# Patient Record
Sex: Male | Born: 1990 | Race: White | Hispanic: No | Marital: Single | Smoking: Never smoker
Health system: Southern US, Community
[De-identification: ages and names within clinical notes are randomized; demographics above are authoritative.]

## PROBLEM LIST (undated history)

## (undated) DIAGNOSIS — M419 Scoliosis, unspecified: Secondary | ICD-10-CM

## (undated) DIAGNOSIS — M545 Low back pain, unspecified: Secondary | ICD-10-CM

## (undated) DIAGNOSIS — K8020 Calculus of gallbladder without cholecystitis without obstruction: Secondary | ICD-10-CM

## (undated) DIAGNOSIS — M5146 Schmorl's nodes, lumbar region: Secondary | ICD-10-CM

## (undated) HISTORY — DX: Schmorl's nodes, lumbar region: M51.46

## (undated) HISTORY — PX: NO PAST SURGERIES: SHX2092

## (undated) HISTORY — DX: Low back pain, unspecified: M54.50

## (undated) HISTORY — DX: Scoliosis, unspecified: M41.9

## (undated) HISTORY — DX: Low back pain: M54.5

## (undated) HISTORY — DX: Calculus of gallbladder without cholecystitis without obstruction: K80.20

---

## 2018-05-19 ENCOUNTER — Encounter: Payer: Self-pay | Admitting: Physician Assistant

## 2018-05-19 ENCOUNTER — Ambulatory Visit (INDEPENDENT_AMBULATORY_CARE_PROVIDER_SITE_OTHER): Payer: BLUE CROSS/BLUE SHIELD

## 2018-05-19 ENCOUNTER — Ambulatory Visit (INDEPENDENT_AMBULATORY_CARE_PROVIDER_SITE_OTHER): Payer: BLUE CROSS/BLUE SHIELD | Admitting: Physician Assistant

## 2018-05-19 ENCOUNTER — Encounter (INDEPENDENT_AMBULATORY_CARE_PROVIDER_SITE_OTHER): Payer: Self-pay

## 2018-05-19 VITALS — BP 123/70 | HR 60 | Ht 74.0 in | Wt 248.0 lb

## 2018-05-19 DIAGNOSIS — Z8739 Personal history of other diseases of the musculoskeletal system and connective tissue: Secondary | ICD-10-CM | POA: Diagnosis not present

## 2018-05-19 DIAGNOSIS — M549 Dorsalgia, unspecified: Secondary | ICD-10-CM | POA: Diagnosis not present

## 2018-05-19 DIAGNOSIS — M25512 Pain in left shoulder: Secondary | ICD-10-CM | POA: Diagnosis not present

## 2018-05-19 DIAGNOSIS — E6609 Other obesity due to excess calories: Secondary | ICD-10-CM | POA: Diagnosis not present

## 2018-05-19 DIAGNOSIS — G8929 Other chronic pain: Secondary | ICD-10-CM

## 2018-05-19 DIAGNOSIS — M542 Cervicalgia: Secondary | ICD-10-CM

## 2018-05-19 DIAGNOSIS — Z13220 Encounter for screening for lipoid disorders: Secondary | ICD-10-CM | POA: Diagnosis not present

## 2018-05-19 DIAGNOSIS — M545 Low back pain, unspecified: Secondary | ICD-10-CM | POA: Insufficient documentation

## 2018-05-19 DIAGNOSIS — Z113 Encounter for screening for infections with a predominantly sexual mode of transmission: Secondary | ICD-10-CM | POA: Diagnosis not present

## 2018-05-19 DIAGNOSIS — Z131 Encounter for screening for diabetes mellitus: Secondary | ICD-10-CM | POA: Diagnosis not present

## 2018-05-19 DIAGNOSIS — Z130 Encounter for screening for diseases of the blood and blood-forming organs and certain disorders involving the immune mechanism: Secondary | ICD-10-CM

## 2018-05-19 DIAGNOSIS — Z7689 Persons encountering health services in other specified circumstances: Secondary | ICD-10-CM

## 2018-05-19 DIAGNOSIS — M62830 Muscle spasm of back: Secondary | ICD-10-CM | POA: Diagnosis not present

## 2018-05-19 NOTE — Progress Notes (Signed)
HPI:                                                                Christopher Morrow is a 27 y.o. male [facility] today to establish care  Current concerns: neck/back/shoulder pain  Reports history of scoliosis diagnosed in his mid-teens. He was treated by a chiropractor as well as physiotherapy at that time. Reports this was helpful. He occasionally has flare-ups of neck and back pain where he reports "sleeping on it wrong." Pain is moderate, intermittent located in the bilateral low back and posterior left-sided neck. Reports a constant dull ache in his left shoulder blade. No known injury or trauma. Denies paresthesias/numbness/tingling/extremity weakness. Denies bowel/bladder dysfunction, saddle numbness, constitutional symptoms. Occasionally takes OTC NSAIDs, which are helpful.  No flowsheet data found.  No flowsheet data found.    Past Medical History:  Diagnosis Date  . Low back pain   . Scoliosis    Past Surgical History:  Procedure Laterality Date  . NO PAST SURGERIES     Social History   Tobacco Use  . Smoking status: Never Smoker  . Smokeless tobacco: Never Used  Substance Use Topics  . Alcohol use: Yes    Alcohol/week: 0.6 oz    Types: 1 Standard drinks or equivalent per week   family history includes Charcot-Marie-Tooth disease in his paternal grandmother; Diabetes in his maternal grandmother; Fibromyalgia in his maternal grandmother; Hypertension in his father, maternal grandmother, and mother; Multiple sclerosis in his father; Obesity in his mother; Rheum arthritis in his maternal grandmother and paternal grandmother.    ROS: Review of Systems  HENT:       + hearing change  Musculoskeletal: Positive for back pain, joint pain, myalgias and neck pain.  All other systems reviewed and are negative.    Medications: Current Outpatient Medications  Medication Sig Dispense Refill  . Multiple  Vitamins-Minerals (MULTIVITAMIN PO) Take by mouth.     No current facility-administered medications for this visit.    No Known Allergies     Objective:  BP 123/70   Pulse 60   Ht 6\' 2"  (1.88 m)   Wt 248 lb (112.5 kg)   BMI 31.84 kg/m  Gen:  alert, not ill-appearing, no distress, appropriate for age, obese male HEENT: head normocephalic without obvious abnormality, conjunctiva and cornea clear, trachea midline Pulm: Normal work of breathing, normal phonation, clear to auscultation bilaterally, no wheezes, rales or rhonchi CV: Normal rate, regular rhythm, s1 and s2 distinct, no murmurs, clicks or rubs  Neuro: alert and oriented x 3, no tremor MSK: extremities atraumatic, normal gait and station Neck/back: atraumatic, no spinous process tenderness, no abnormal curvature Left shoulder: atraumatic, no a/c joint tenderness, ROM intact, strength 5/5, negative Hawkins, negative Neers, negative lift off Skin: intact, no rashes on exposed skin, no jaundice, no cyanosis Psych: well-groomed, cooperative, good eye contact, euthymic mood, affect mood-congruent, speech is articulate, and thought processes clear and goal-directed    No results found for this or any previous visit (from the past 72 hour(s)). Dg Cervical Spine Complete  Result Date: 05/19/2018 CLINICAL DATA:  Chronic neck pain left-sided worse when raising arm. EXAM: CERVICAL SPINE - COMPLETE 4+ VIEW COMPARISON:  None. FINDINGS:  Vertebral body alignment, heights and disc space heights are normal. There is minimal uncovertebral joint spurring present. Atlantoaxial articulation is normal. Neural foramina are patent bilaterally. IMPRESSION: Minimal uncovertebral joint spurring, otherwise unremarkable cervical spine. Electronically Signed   By: Elberta Fortis M.D.   On: 05/19/2018 15:01   Dg Lumbar Spine Complete  Result Date: 05/19/2018 CLINICAL DATA:  27 year old male with chronic mid lower back pain for years. No injury. Initial  encounter. EXAM: LUMBAR SPINE - COMPLETE 4+ VIEW COMPARISON:  None. FINDINGS: Slight straightening of the lumbar spine without significant disc space narrowing. Minimal to mild Schmorl's node deformities L1-2 through L5-S1 (most notable inferior aspect L5). No fracture or pars defect noted. Multiple calcified gallstones. IMPRESSION: Minimal to mild Schmorl's node deformities L1-2 through L5-S1 (most notable inferior aspect L5). Multiple calcified gallstones. Electronically Signed   By: Lacy Duverney M.D.   On: 05/19/2018 15:03      Assessment and Plan: 27 y.o. male with   Encounter to establish care  Routine screening for STI (sexually transmitted infection) - Plan: C. trachomatis/N. gonorrhoeae RNA, Hepatitis C antibody, HIV antibody, RPR  History of scoliosis - Plan: DG Lumbar Spine Complete  Chronic bilateral low back pain without sciatica - Plan: DG Lumbar Spine Complete, Ambulatory referral to Physical Therapy  Chronic left shoulder pain - Plan: Ambulatory referral to Physical Therapy  Screening for lipid disorders - Plan: Lipid Panel w/reflex Direct LDL  Screening for blood disease - Plan: CBC, Comprehensive metabolic panel  Screening for diabetes mellitus - Plan: Comprehensive metabolic panel  Class 1 obesity due to excess calories in adult, unspecified BMI, unspecified whether serious comorbidity present - Plan: Comprehensive metabolic panel, Lipid Panel w/reflex Direct LDL  Chronic neck and back pain - Plan: DG Cervical Spine Complete, Ambulatory referral to Physical Therapy  - Personally reviewed PMH, PSH, PFH, medications, allergies, HM - Age-appropriate cancer screening: n/a - Influenza n/a - Tdap UTD per patient  Chronic neck and back pain - atraumatic, recurrent neck/left shoulder pain and b/l LBP since patient was a teenager. No red flag symptoms. Benign exam - plain films of c-spine and l-spine today - referral placed for formal PT - continue NSAID prn -  Magnesium oil at bedtime, TENS unit, heating pad    Patient education and anticipatory guidance given Patient agrees with treatment plan Follow-up with Sports Medicine in 6 weeks or sooner as needed if symptoms worsen or fail to improve  Levonne Hubert PA-C

## 2018-05-19 NOTE — Patient Instructions (Signed)
For Back/Neck/Shoulder pain - x-rays today - formal PT for 6 weeks - Aleve 1-2 caps or tabs every 12 hours as needed for moderate-severe pain - TENS unit, heating pad, and foam roller may be beneficial - apply Magnesium oil to neck/upper back nightly - follow-up with Sports Medicine if no improvement in 6 weeks

## 2018-05-20 ENCOUNTER — Encounter: Payer: Self-pay | Admitting: Physician Assistant

## 2018-05-20 DIAGNOSIS — M5146 Schmorl's nodes, lumbar region: Secondary | ICD-10-CM

## 2018-05-20 DIAGNOSIS — K8020 Calculus of gallbladder without cholecystitis without obstruction: Secondary | ICD-10-CM

## 2018-05-20 DIAGNOSIS — M62830 Muscle spasm of back: Secondary | ICD-10-CM | POA: Insufficient documentation

## 2018-05-20 HISTORY — DX: Schmorl's nodes, lumbar region: M51.46

## 2018-05-20 HISTORY — DX: Calculus of gallbladder without cholecystitis without obstruction: K80.20

## 2018-05-20 MED ORDER — CYCLOBENZAPRINE HCL 5 MG PO TABS: 5.0000 mg | ORAL_TABLET | Freq: Every evening | ORAL | 0 refills | Status: AC | PRN

## 2018-05-20 NOTE — Addendum Note (Signed)
Addended by: Gena FrayUMMINGS, Christina Waldrop E on: 05/20/2018 04:25 PM   Modules accepted: Orders

## 2018-05-20 NOTE — Progress Notes (Signed)
X-rays show straightening of the spine, consistent with muscle spasm. 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 As long as there is no abdominal pain, no follow-up is indicated Labs look great STI screening is negative

## 2018-09-01 LAB — HEPATITIS C ANTIBODY
Hepatitis C Ab: NONREACTIVE
SIGNAL TO CUT-OFF: 0.02 (ref <1.00)

## 2018-09-01 LAB — LIPID PANEL W/REFLEX DIRECT LDL
CHOL/HDL RATIO: 4.6 (calc) (ref <5.0)
Cholesterol: 180 mg/dL (ref <200)
HDL: 39 mg/dL — ABNORMAL LOW (ref >40)
LDL CHOLESTEROL (CALC): 116 mg/dL — AB
Non-HDL Cholesterol (Calc): 141 mg/dL (calc) — ABNORMAL HIGH (ref <130)
TRIGLYCERIDES: 139 mg/dL (ref <150)

## 2018-09-01 LAB — C. TRACHOMATIS/N. GONORRHOEAE RNA
C. trachomatis RNA, TMA: NOT DETECTED
N. gonorrhoeae RNA, TMA: NOT DETECTED

## 2018-09-01 LAB — HIV ANTIBODY (ROUTINE TESTING W REFLEX): HIV: NONREACTIVE

## 2018-09-01 LAB — COMPREHENSIVE METABOLIC PANEL
AG Ratio: 2.3 (calc) (ref 1.0–2.5)
ALBUMIN MSPROF: 5 g/dL (ref 3.6–5.1)
ALT: 34 U/L (ref 9–46)
AST: 21 U/L (ref 10–40)
Alkaline phosphatase (APISO): 62 U/L (ref 40–115)
BUN: 11 mg/dL (ref 7–25)
CALCIUM: 10.2 mg/dL (ref 8.6–10.3)
CO2: 31 mmol/L (ref 20–32)
CREATININE: 0.86 mg/dL (ref 0.60–1.35)
Chloride: 103 mmol/L (ref 98–110)
Globulin: 2.2 g/dL (calc) (ref 1.9–3.7)
Glucose, Bld: 93 mg/dL (ref 65–99)
POTASSIUM: 4.3 mmol/L (ref 3.5–5.3)
Sodium: 140 mmol/L (ref 135–146)
TOTAL PROTEIN: 7.2 g/dL (ref 6.1–8.1)
Total Bilirubin: 0.8 mg/dL (ref 0.2–1.2)

## 2018-09-01 LAB — CBC
HCT: 44.8 % (ref 38.5–50.0)
Hemoglobin: 15.4 g/dL (ref 13.2–17.1)
MCH: 29.6 pg (ref 27.0–33.0)
MCHC: 34.4 g/dL (ref 32.0–36.0)
MCV: 86 fL (ref 80.0–100.0)
MPV: 10.5 fL (ref 7.5–12.5)
PLATELETS: 279 10*3/uL (ref 140–400)
RBC: 5.21 10*6/uL (ref 4.20–5.80)
RDW: 12.9 % (ref 11.0–15.0)
WBC: 5.9 10*3/uL (ref 3.8–10.8)

## 2018-09-01 LAB — RPR: RPR: NONREACTIVE

## 2019-02-18 ENCOUNTER — Telehealth: Payer: Self-pay

## 2019-02-18 NOTE — Telephone Encounter (Signed)
Pt calls office to be triaged for possible flu SX he feels he is having. COVID questionnaire completed as follows:  [facility] MYCHART COVID19 SCREENING    Questions   1. Do you have any of the following?  Cough, chest pain, fever, chills, congestion, sore throat  2. If you are experiencing trouble breathing please select the severity of this: No  3. Do you have any of the following additional symptoms? No  4. Have you had a fever? Yes  5. If you are running a fever, please type in your temperature reading 101 highest  6. How long have you had the fever? After 6PM 3/18  7. Have others in your home or workplace had similar symptoms? No  8. When did your symptoms start? 3/17  9. Have you recently visited any of the following countries? No  10. If you have traveled anywhere in the last 2 months please document where you have visited: N/A  37. Have you recently been around others from these countries or visited these countries who have had coughing or fever? 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 Have you recently been around anyone who has been diagnosed with Corona virus? No  13. Have you been taking any medications? Nyquil &cough drops  14. If taking medications for these symptoms, please list the names and whether they are helping or not Helps suppress cough  15. Are you treated for any of the following conditions: Asthma, COPD, Diabetes, Renal Failure (on Dialysis), AIDS, any Neuromuscular disease that effects the clearing of secretions, Heart Failure, or Heart Disease? No  16. Please enter a phone number where you can be reached if we have additional questions about your symptoms [phone]  17. Please list your medication allergies that you may have ? (If 'none' , please list as 'none') None  18. Please list any additional comments n/a  19. Are you pregnant? No  20. Are you breastfeeding? No

## 2019-02-19 ENCOUNTER — Ambulatory Visit: Payer: BLUE CROSS/BLUE SHIELD | Admitting: Physician Assistant

## 2019-02-19 ENCOUNTER — Encounter: Payer: Self-pay | Admitting: Physician Assistant

## 2019-02-19 ENCOUNTER — Ambulatory Visit: Payer: Self-pay | Admitting: Physician Assistant

## 2019-02-19 NOTE — Telephone Encounter (Signed)
Left message for a return call. Also stated in the message he doesn't need to come in today.

## 2019-02-19 NOTE — Telephone Encounter (Signed)
Work note written to cover for now until reeval on Monday

## 2019-02-19 NOTE — Telephone Encounter (Signed)
Recommend patient stay home and do supportive care He should only be seen in the office if he is having shortness of breath and/or chest pain I can write a work note for the remainder of the week Should not return to work until he is fever free for 48 hours In terms of supportive care:  For fever above 101: Tylenol 1000mg  every 8 hours as needed and Ibuprofen 600mg  every 6 hours. Alternate every 4 hours  For nasal symptoms/sinusitis: - nasal saline rinses / netti pot several times per day (do this prior to nasal spray) - you can use OTC nasal decongestant sprays like Afrin (oxymetazoline) BUT do not use for more than 3 days as it will cause worsening congestion/nasal symptoms) - warm facial compresses - oral decongestants and antihistamines like Claritin-D and Zyrtec-D may help dry up secretions (caution using decongestants if you have high blood pressure, heart disease or kidney disease)   For sore throat: - Cepacol throat lozenges and/or Chloraseptic spray - Warm salt water gargles  For cough: - Cough is a protective mechanism and an important part of fighting an infection. I encourage you to avoid suppressing your cough with medication during the day if possible.  - Mucinex with at least 8 oz. of water can loosen chest congestion and make cough more productive, which means you will actually cough less - Okay to use a cough suppressant at bedtime in order to rest (Nyquil, Delsym, Robitussin, etc.)  Note: follow package instructions for all over-the-counter medications. If using multi-symptom medications (Dayquil, Theraflu, etc.), check the label for duplicate drug ingredients.

## 2019-02-19 NOTE — Telephone Encounter (Signed)
Spoke with Pt, advised of PCP recommendation. Scheduled phone call update with PCP on Monday.

## 2019-02-19 NOTE — Telephone Encounter (Signed)
Left VM for Pt to return clinic call.  

## 2019-02-22 ENCOUNTER — Ambulatory Visit (INDEPENDENT_AMBULATORY_CARE_PROVIDER_SITE_OTHER): Payer: Self-pay | Admitting: Physician Assistant

## 2019-02-22 ENCOUNTER — Other Ambulatory Visit: Payer: Self-pay

## 2019-02-22 ENCOUNTER — Encounter: Payer: Self-pay | Admitting: Physician Assistant

## 2019-02-22 VITALS — HR 65

## 2019-02-22 DIAGNOSIS — J22 Unspecified acute lower respiratory infection: Secondary | ICD-10-CM

## 2019-02-22 MED ORDER — ALBUTEROL SULFATE HFA 108 (90 BASE) MCG/ACT IN AERS: 1.0000 | INHALATION_SPRAY | RESPIRATORY_TRACT | 0 refills | Status: AC | PRN

## 2019-02-22 MED ORDER — AZITHROMYCIN 250 MG PO TABS: ORAL_TABLET | ORAL | 0 refills | Status: AC

## 2019-02-22 NOTE — Progress Notes (Addendum)
Virtual Visit via Telephone Note  I connected with Christopher Morrow on 02/28/19 at  1:40 PM EDT by telephone and verified that I am speaking with the correct person using two identifiers.   I discussed the limitations, risks, security and privacy concerns of performing an evaluation and management service by telephone and the availability of in person appointments. I also discussed with the patient that there may be a patient responsible charge related to this service. The patient expressed understanding and agreed to proceed.   HPI:                                                                 Cough  This is a new problem. The current episode started in the past 7 days (x 1 week). The problem has been gradually worsening. The cough is productive of sputum and productive of blood-tinged sputum. Associated symptoms include chest pain, chills, a fever (tactile), nasal congestion, a sore throat and shortness of breath. Pertinent negatives include no ear pain, hemoptysis or sweats. Exacerbated by: activity. He has tried OTC cough suppressant and rest for the symptoms. The treatment provided no relief. His past medical history is significant for pneumonia.     Past Medical History:  Diagnosis Date  . Asymptomatic gallstones 05/20/2018  . Low back pain   . 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 Scoliosis    Past Surgical History:  Procedure Laterality Date  . NO PAST SURGERIES     Social History   Tobacco Use  . Smoking status: Never Smoker  . Smokeless tobacco: Never Used  Substance Use Topics  . Alcohol use: Yes    Alcohol/week: 1.0 standard drinks    Types: 1 Standard drinks or equivalent per week   family history includes Charcot-Marie-Tooth disease in his paternal grandmother; Diabetes in his maternal grandmother; Fibromyalgia in his maternal grandmother; Hypertension in his father, maternal grandmother, and mother; Multiple sclerosis in his father; Obesity in his mother; Rheum  arthritis in his maternal grandmother and paternal grandmother.    Review of Systems  Constitutional: Positive for chills and fever (tactile).  HENT: Positive for sore throat. Negative for ear pain.   Respiratory: Positive for cough and shortness of breath. Negative for hemoptysis.   Cardiovascular: Positive for chest pain.     Medications: Current Outpatient Medications  Medication Sig Dispense Refill  . cyclobenzaprine (FLEXERIL) 5 MG tablet Take 1-2 tablets (5-10 mg total) by mouth at bedtime as needed for muscle spasms. 30 tablet 0  . Multiple Vitamins-Minerals (MULTIVITAMIN PO) Take by mouth.     No current facility-administered medications for this visit.    No Known Allergies     Objective:  Pulse 65  Pulm: speaking in full sentences, normal work of breathing, normal phonation   No results found for this or any previous visit (from the past 72 hour(s)). No results found.    Assessment and Plan: 28 y.o. male with   .Diagnoses and all orders for this visit:  Acute lower respiratory infection -     azithromycin (ZITHROMAX Z-PAK) 250 MG tablet; Take 2 tablets (500 mg) on  Day 1,  followed by 1 tablet (250 mg) once daily on Days 2 through 5. -     albuterol (PROVENTIL HFA;VENTOLIN  HFA) 108 (90 Base) MCG/ACT inhaler; Inhale 1-2 puffs into the lungs every 4 (four) hours as needed for wheezing or shortness of breath (bronchospasm).   1 week of tactile fevers, cough and SOB, gradually worsening Patient endorses blood-tinged sputum  Risk factors for pneumonia include - hx of pneumonia Speaking in full sentences, respirations unlabored on the phone Treating empirically for CAP with Azithromcyin Patient was advised to remain out of work for at least 1 week since symptom onset and until he has been without fever (without fever reducer) for 48 hours and improvement of respiratory symptoms Follow-up in 2 days for e-visit   I discussed the assessment and treatment plan  with the patient. The patient was provided an opportunity to ask questions and all were answered. The patient agreed with the plan and demonstrated an understanding of the instructions.   The patient was advised to call back or seek an in-person evaluation if the symptoms worsen or if the condition fails to improve as anticipated.  I provided 10 minutes of non-face-to-face time during this encounter.   Carlis Stable, [place]

## 2019-02-24 ENCOUNTER — Other Ambulatory Visit: Payer: Self-pay

## 2019-02-24 ENCOUNTER — Encounter: Payer: Self-pay | Admitting: Physician Assistant

## 2019-02-24 ENCOUNTER — Ambulatory Visit (INDEPENDENT_AMBULATORY_CARE_PROVIDER_SITE_OTHER): Payer: Self-pay | Admitting: Physician Assistant

## 2019-02-24 DIAGNOSIS — R0602 Shortness of breath: Secondary | ICD-10-CM

## 2019-02-24 DIAGNOSIS — R042 Hemoptysis: Secondary | ICD-10-CM

## 2019-02-24 DIAGNOSIS — R058 Other specified cough: Secondary | ICD-10-CM

## 2019-02-24 DIAGNOSIS — R05 Cough: Secondary | ICD-10-CM

## 2019-02-24 NOTE — Progress Notes (Addendum)
Virtual Visit via Telephone Note  I connected withJoel Morrow by telephoneand verified that I am speaking with the correct person using two identifiers.  I discussed the limitations, risks, security and privacy concerns of performing an evaluation and management service by telephone and the availability of in person appointments. I also discussed with the patient that there may be a patient responsible charge related to this service. The patient expressed understanding and agreed to proceed.   HPI:       Cough  This is a new problem. The current episode started in the past 7 days (x 1 week). The problem has been gradually worsening. The cough is productive of sputum and productive of blood-tinged sputum. Associated symptoms include chest pain, chills, a fever (tactile), nasal congestion, a sore throat and shortness of breath. Pertinent negatives include no ear pain, hemoptysis or sweats. Exacerbated by: activity. He has tried OTC cough suppressant and rest for the symptoms. The treatment provided no relief. His past medical history is significant for pneumonia.   02/24/2019 - patient has had 2 doses of Azithromcyin. Reports no improvement in his symptoms. States his shortness of breath is gradually worsening and he still feels feverish and having cough productive of blood-tinged sputum  Past Medical History:  Diagnosis Date  . Asymptomatic gallstones 05/20/2018  . Low back pain   . 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 Scoliosis    Past Surgical History:  Procedure Laterality Date  . NO PAST SURGERIES     Social History   Tobacco Use  . Smoking status: Never Smoker  . Smokeless tobacco: Never Used  Substance Use Topics  . Alcohol use: Yes    Alcohol/week: 1.0 standard drinks    Types: 1 Standard drinks or equivalent per week   family history includes Charcot-Marie-Tooth disease in his paternal grandmother; Diabetes in his maternal grandmother; Fibromyalgia in his  maternal grandmother; Hypertension in his father, maternal grandmother, and mother; Multiple sclerosis in his father; Obesity in his mother; Rheum arthritis in his maternal grandmother and paternal grandmother.    Review of Systems  Constitutional: Positive for chills and fever (tactile).  HENT: Positive for sore throat. Negative for ear pain.   Respiratory: Positive for cough and shortness of breath. Negative for hemoptysis.   Cardiovascular: Positive for chest pain.     Medications: Current Outpatient Medications  Medication Sig Dispense Refill  . albuterol (PROVENTIL HFA;VENTOLIN HFA) 108 (90 Base) MCG/ACT inhaler Inhale 1-2 puffs into the lungs every 4 (four) hours as needed for wheezing or shortness of breath (bronchospasm). 1 Inhaler 0  . azithromycin (ZITHROMAX Z-PAK) 250 MG tablet Take 2 tablets (500 mg) on  Day 1,  followed by 1 tablet (250 mg) once daily on Days 2 through 5. 6 tablet 0  . cyclobenzaprine (FLEXERIL) 5 MG tablet Take 1-2 tablets (5-10 mg total) by mouth at bedtime as needed for muscle spasms. 30 tablet 0  . Multiple Vitamins-Minerals (MULTIVITAMIN PO) Take by mouth.     No current facility-administered medications for this visit.    No Known Allergies     Objective:  There were no vitals taken for this visit. Pulm: speaking in full sentences, normal work of breathing, normal phonation   No results found for this or any previous visit (from the past 72 hour(s)). No results found.    Assessment and Plan: 28 y.o. male with   .Diagnoses and all orders for this visit:  Shortness of breath  Blood-tinged sputum  Productive  cough   9 days of tactile fevers, cough and SOB, gradually worsening Patient endorses blood-tinged sputum  He has not responded to empiric treatment for community acquired pneumonia with azithromycin I advised him to go to his nearest ER to be evaluated  I discussed the assessment and treatment plan with the patient. The  patient was provided an opportunity to ask questions and all were answered. The patient agreed with the plan and demonstrated an understanding of the instructions.  I provided 5-10 minutes of non-face-to-face time during this encounter.  Levonne Hubert PA-C

## 2019-09-23 IMAGING — DX DG LUMBAR SPINE COMPLETE 4+V
5 series · 5 of 5 positions shown · non-contrast
Comparison: None.

CLINICAL DATA: 26-year-old male with chronic mid lower back pain
for years. No injury. Initial encounter.

EXAM:
LUMBAR SPINE - COMPLETE 4+ VIEW

[l-spine ap]
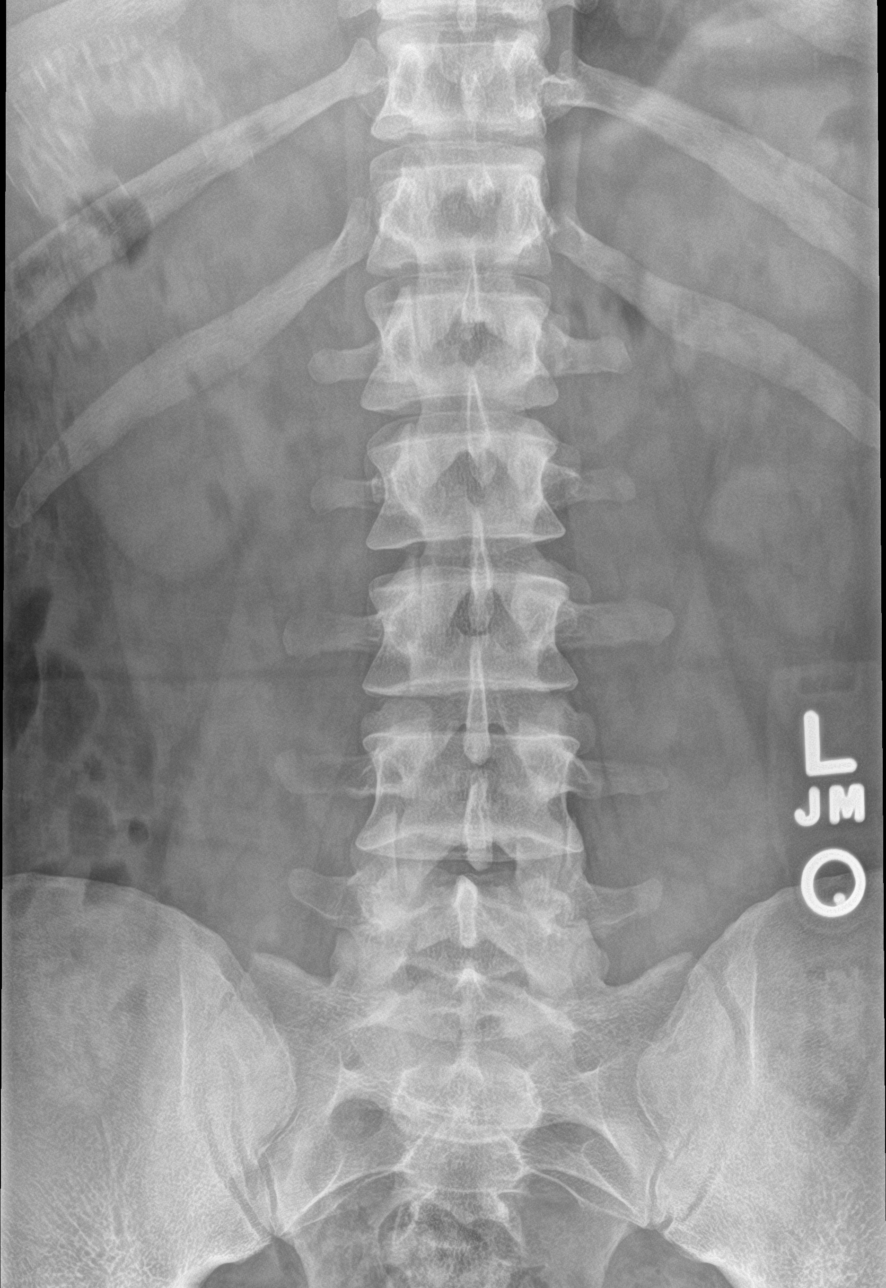

[l-spine obl (1 of 2)]
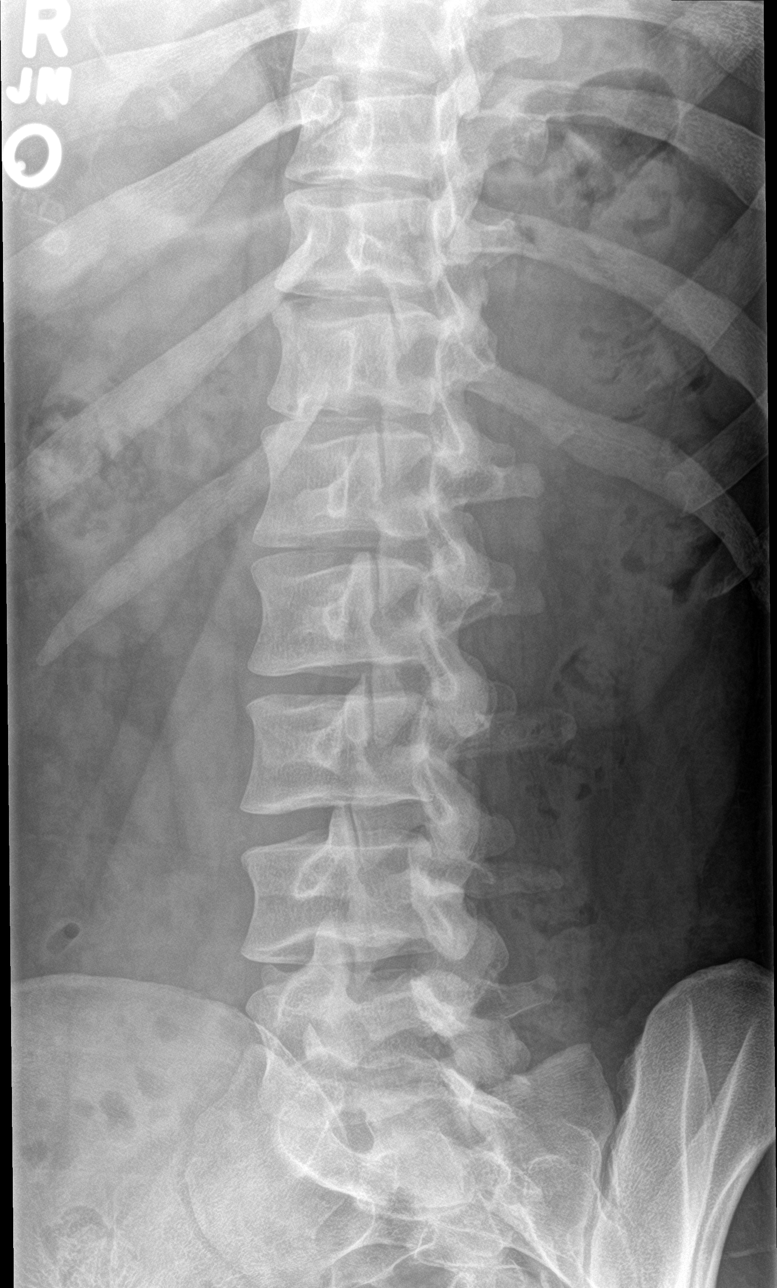

[l-spine obl (2 of 2)]
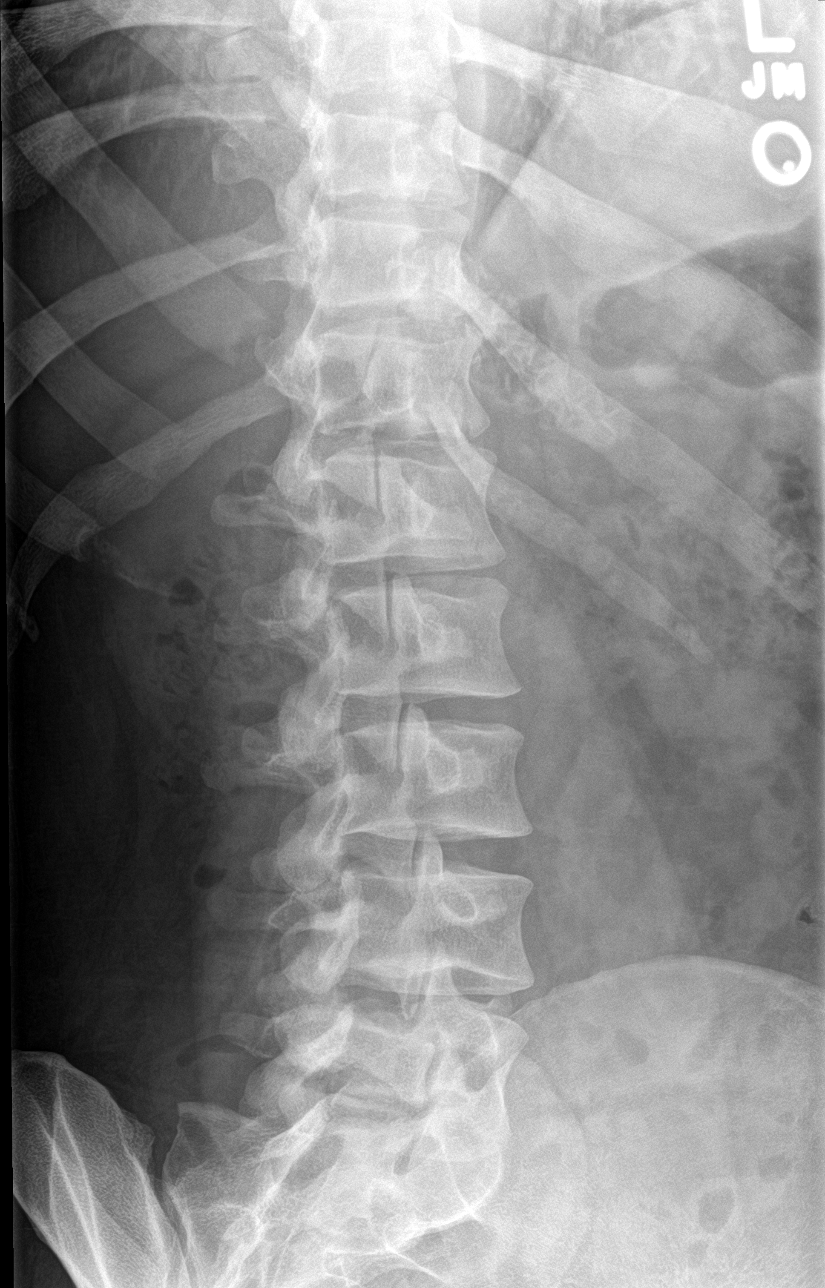

[l-spine lat]
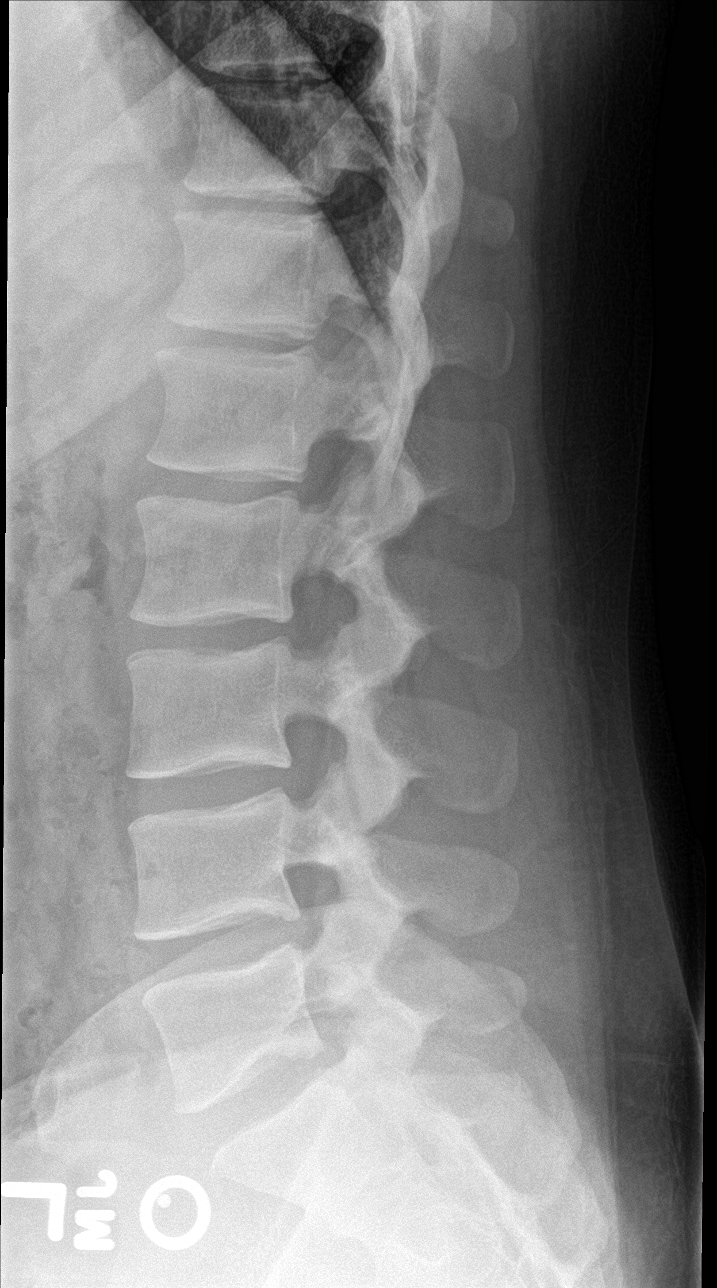

[l-spine spot]
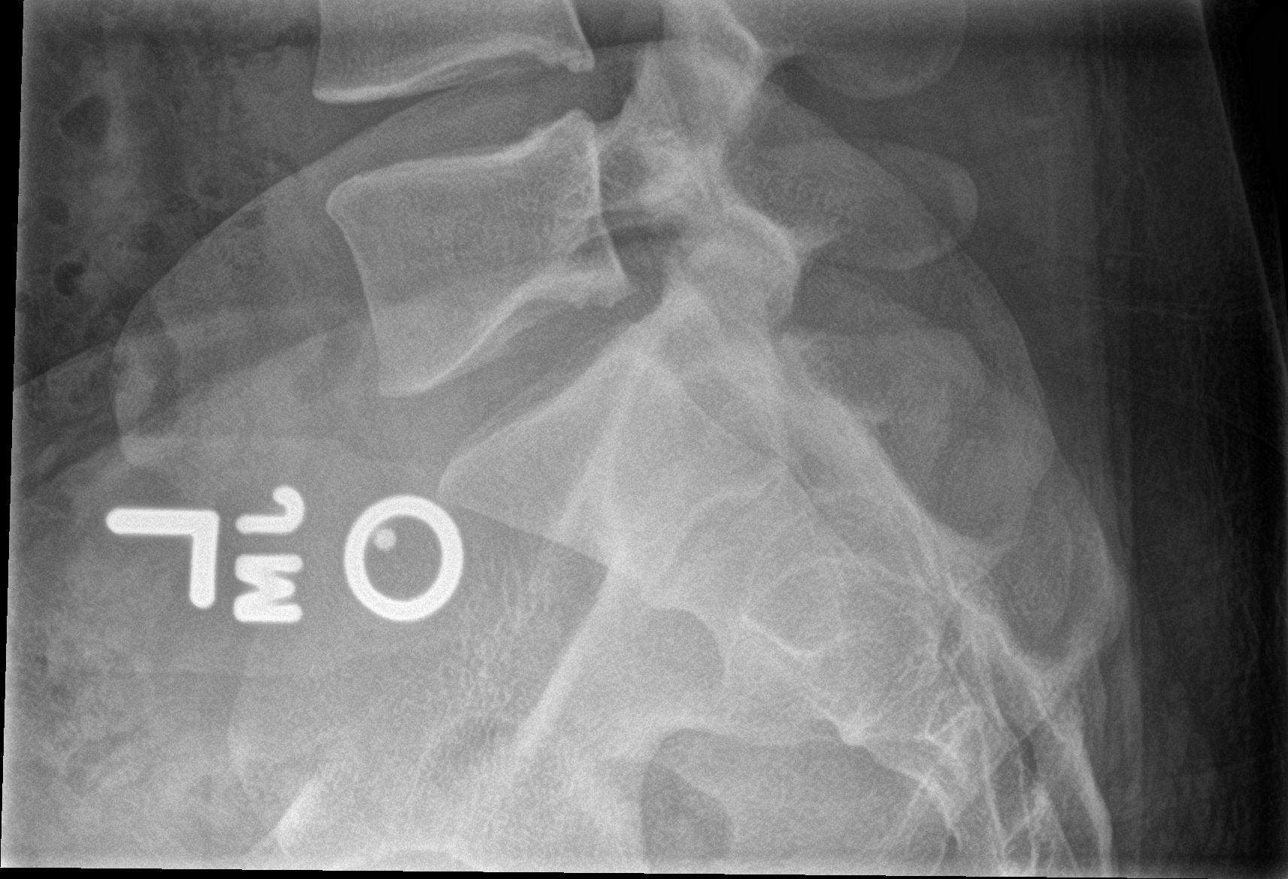

[5 of 5 positions shown; findings below may reference images not displayed]

FINDINGS: Slight straightening of the lumbar spine without significant disc
space narrowing.

Minimal to mild Schmorl's node deformities L1-2 through L5-S1 (most
notable inferior aspect L5).

No fracture or pars defect noted.

Multiple calcified gallstones.
IMPRESSION: Minimal to mild Schmorl's node deformities L1-2 through L5-S1 (most
notable inferior aspect L5).

Multiple calcified gallstones.

## 2020-05-03 DIAGNOSIS — S62634A Displaced fracture of distal phalanx of right ring finger, initial encounter for closed fracture: Secondary | ICD-10-CM | POA: Diagnosis not present

## 2020-05-03 DIAGNOSIS — S62322D Displaced fracture of shaft of third metacarpal bone, right hand, subsequent encounter for fracture with routine healing: Secondary | ICD-10-CM | POA: Diagnosis not present

## 2020-05-03 DIAGNOSIS — S62632A Displaced fracture of distal phalanx of right middle finger, initial encounter for closed fracture: Secondary | ICD-10-CM | POA: Diagnosis not present

## 2020-05-03 DIAGNOSIS — S6992XA Unspecified injury of left wrist, hand and finger(s), initial encounter: Secondary | ICD-10-CM | POA: Diagnosis not present

## 2020-05-03 DIAGNOSIS — S62324A Displaced fracture of shaft of fourth metacarpal bone, right hand, initial encounter for closed fracture: Secondary | ICD-10-CM | POA: Diagnosis not present

## 2020-05-03 DIAGNOSIS — S62322A Displaced fracture of shaft of third metacarpal bone, right hand, initial encounter for closed fracture: Secondary | ICD-10-CM | POA: Diagnosis not present

## 2020-05-05 DIAGNOSIS — Z01812 Encounter for preprocedural laboratory examination: Secondary | ICD-10-CM | POA: Diagnosis not present

## 2020-05-05 DIAGNOSIS — S62322D Displaced fracture of shaft of third metacarpal bone, right hand, subsequent encounter for fracture with routine healing: Secondary | ICD-10-CM | POA: Diagnosis not present

## 2020-05-05 DIAGNOSIS — Z20822 Contact with and (suspected) exposure to covid-19: Secondary | ICD-10-CM | POA: Diagnosis not present

## 2020-05-05 DIAGNOSIS — X58XXXD Exposure to other specified factors, subsequent encounter: Secondary | ICD-10-CM | POA: Diagnosis not present

## 2020-05-09 DIAGNOSIS — S62304A Unspecified fracture of fourth metacarpal bone, right hand, initial encounter for closed fracture: Secondary | ICD-10-CM | POA: Diagnosis not present

## 2020-05-09 DIAGNOSIS — S62324A Displaced fracture of shaft of fourth metacarpal bone, right hand, initial encounter for closed fracture: Secondary | ICD-10-CM | POA: Diagnosis not present

## 2020-05-09 DIAGNOSIS — S62302A Unspecified fracture of third metacarpal bone, right hand, initial encounter for closed fracture: Secondary | ICD-10-CM | POA: Diagnosis not present

## 2020-05-09 DIAGNOSIS — S62322A Displaced fracture of shaft of third metacarpal bone, right hand, initial encounter for closed fracture: Secondary | ICD-10-CM | POA: Diagnosis not present
